# Patient Record
Sex: Male | Born: 1990 | Race: Black or African American | Hispanic: No | Marital: Single | Smoking: Current every day smoker
Health system: Southern US, Community
[De-identification: ages and names within clinical notes are randomized; demographics above are authoritative.]

---

## 2011-07-22 ENCOUNTER — Encounter (HOSPITAL_BASED_OUTPATIENT_CLINIC_OR_DEPARTMENT_OTHER): Payer: Self-pay | Admitting: *Deleted

## 2011-07-22 ENCOUNTER — Emergency Department (HOSPITAL_BASED_OUTPATIENT_CLINIC_OR_DEPARTMENT_OTHER)
Admission: EM | Admit: 2011-07-22 | Discharge: 2011-07-22 | Disposition: A | Discharge status: Home / Self Care | Payer: Self-pay | Attending: Emergency Medicine | Admitting: Emergency Medicine

## 2011-07-22 DIAGNOSIS — T24239A Burn of second degree of unspecified lower leg, initial encounter: Secondary | ICD-10-CM

## 2011-07-22 DIAGNOSIS — F17200 Nicotine dependence, unspecified, uncomplicated: Secondary | ICD-10-CM | POA: Insufficient documentation

## 2011-07-22 DIAGNOSIS — X131XXA Other contact with steam and other hot vapors, initial encounter: Secondary | ICD-10-CM | POA: Insufficient documentation

## 2011-07-22 DIAGNOSIS — X12XXXA Contact with other hot fluids, initial encounter: Secondary | ICD-10-CM | POA: Insufficient documentation

## 2011-07-22 DIAGNOSIS — T25219A Burn of second degree of unspecified ankle, initial encounter: Secondary | ICD-10-CM | POA: Insufficient documentation

## 2011-07-22 DIAGNOSIS — Y93G3 Activity, cooking and baking: Secondary | ICD-10-CM | POA: Insufficient documentation

## 2011-07-22 DIAGNOSIS — T25229A Burn of second degree of unspecified foot, initial encounter: Secondary | ICD-10-CM | POA: Insufficient documentation

## 2011-07-22 MED ORDER — SILVER SULFADIAZINE 1 % EX CREA: TOPICAL_CREAM | Freq: Every day | CUTANEOUS | Status: AC

## 2011-07-22 MED ORDER — OXYCODONE-ACETAMINOPHEN 5-325 MG PO TABS: 1.0000 | ORAL_TABLET | Freq: Four times a day (QID) | ORAL | Status: AC | PRN

## 2011-07-22 MED ORDER — SILVER SULFADIAZINE 1 % EX CREA
TOPICAL_CREAM | CUTANEOUS | Status: AC
  Filled 2011-07-22: qty 85

## 2011-07-22 MED ORDER — SILVER SULFADIAZINE 1 % EX CREA
TOPICAL_CREAM | Freq: Once | CUTANEOUS | Status: AC
  Administered 2011-07-22: 16:00:00 via TOPICAL
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
## 2013-04-11 ENCOUNTER — Emergency Department (HOSPITAL_BASED_OUTPATIENT_CLINIC_OR_DEPARTMENT_OTHER): Payer: No Typology Code available for payment source

## 2013-04-11 ENCOUNTER — Encounter (HOSPITAL_BASED_OUTPATIENT_CLINIC_OR_DEPARTMENT_OTHER): Payer: Self-pay | Admitting: Emergency Medicine

## 2013-04-11 ENCOUNTER — Emergency Department (HOSPITAL_BASED_OUTPATIENT_CLINIC_OR_DEPARTMENT_OTHER)
Admission: EM | Admit: 2013-04-11 | Discharge: 2013-04-11 | Disposition: A | Discharge status: Home / Self Care | Payer: No Typology Code available for payment source | Attending: Emergency Medicine | Admitting: Emergency Medicine

## 2013-04-11 DIAGNOSIS — Y9389 Activity, other specified: Secondary | ICD-10-CM | POA: Insufficient documentation

## 2013-04-11 DIAGNOSIS — R079 Chest pain, unspecified: Secondary | ICD-10-CM

## 2013-04-11 DIAGNOSIS — M542 Cervicalgia: Secondary | ICD-10-CM

## 2013-04-11 DIAGNOSIS — S199XXA Unspecified injury of neck, initial encounter: Principal | ICD-10-CM

## 2013-04-11 DIAGNOSIS — S0993XA Unspecified injury of face, initial encounter: Secondary | ICD-10-CM | POA: Insufficient documentation

## 2013-04-11 DIAGNOSIS — F17200 Nicotine dependence, unspecified, uncomplicated: Secondary | ICD-10-CM | POA: Insufficient documentation

## 2013-04-11 DIAGNOSIS — M25579 Pain in unspecified ankle and joints of unspecified foot: Secondary | ICD-10-CM

## 2013-04-11 DIAGNOSIS — S298XXA Other specified injuries of thorax, initial encounter: Secondary | ICD-10-CM | POA: Insufficient documentation

## 2013-04-11 DIAGNOSIS — Y92410 Unspecified street and highway as the place of occurrence of the external cause: Secondary | ICD-10-CM | POA: Insufficient documentation

## 2013-04-11 MED ORDER — HYDROCODONE-ACETAMINOPHEN 5-325 MG PO TABS
1.0000 | ORAL_TABLET | Freq: Once | ORAL | Status: AC
  Administered 2013-04-11: 1 via ORAL
  Filled 2013-04-11: qty 1

## 2013-04-11 MED ORDER — IBUPROFEN 800 MG PO TABS: 800.0000 mg | ORAL_TABLET | Freq: Three times a day (TID) | ORAL | Status: DC

## 2013-04-11 MED ORDER — HYDROCODONE-ACETAMINOPHEN 5-325 MG PO TABS: 1.0000 | ORAL_TABLET | ORAL | Status: DC | PRN

## 2013-04-11 MED ORDER — IBUPROFEN 800 MG PO TABS
800.0000 mg | ORAL_TABLET | Freq: Once | ORAL | Status: AC
  Administered 2013-04-11: 800 mg via ORAL
  Filled 2013-04-11: qty 1
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
## 2013-04-11 NOTE — ED Provider Notes (Signed)
Medical screening examination/treatment/procedure(s) were performed by non-physician practitioner and as supervising physician I was immediately available for consultation/collaboration.  EKG Interpretation   None         Layla MawKristen N Bethanne Mule, DO 04/11/13 1510
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
## 2013-04-30 ENCOUNTER — Emergency Department (HOSPITAL_BASED_OUTPATIENT_CLINIC_OR_DEPARTMENT_OTHER)
Admission: EM | Admit: 2013-04-30 | Discharge: 2013-04-30 | Disposition: A | Discharge status: Home / Self Care | Payer: No Typology Code available for payment source | Attending: Emergency Medicine | Admitting: Emergency Medicine

## 2013-04-30 ENCOUNTER — Encounter (HOSPITAL_BASED_OUTPATIENT_CLINIC_OR_DEPARTMENT_OTHER): Payer: Self-pay | Admitting: Emergency Medicine

## 2013-04-30 DIAGNOSIS — M542 Cervicalgia: Secondary | ICD-10-CM

## 2013-04-30 DIAGNOSIS — F17200 Nicotine dependence, unspecified, uncomplicated: Secondary | ICD-10-CM | POA: Insufficient documentation

## 2013-04-30 MED ORDER — METHOCARBAMOL 500 MG PO TABS: 500.0000 mg | ORAL_TABLET | Freq: Two times a day (BID) | ORAL | Status: DC

## 2013-04-30 MED ORDER — HYDROCODONE-ACETAMINOPHEN 5-325 MG PO TABS: 2.0000 | ORAL_TABLET | ORAL | Status: DC | PRN

## 2013-04-30 MED ORDER — IBUPROFEN 800 MG PO TABS: 800.0000 mg | ORAL_TABLET | Freq: Three times a day (TID) | ORAL | Status: DC

## 2013-04-30 NOTE — ED Provider Notes (Signed)
CSN: [id number]     Arrival date & time 04/30/13  1324 History   First MD Initiated Contact with Patient 04/30/13 1435     Chief Complaint  Patient presents with  . Neck Pain     (Consider location/radiation/quality/duration/timing/severity/associated sxs/prior Treatment) Patient is a 23 y.o. male presenting with neck pain. The history is provided by the patient.  Neck Pain Pain location:  Generalized neck Pain radiates to:  Does not radiate Pain severity:  Moderate Pain is:  Worse during the day Onset quality:  Sudden Timing:  Constant Progression:  Worsening Chronicity:  New Relieved by:  Nothing Worsened by:  Nothing tried Ineffective treatments:  None tried   History reviewed. No pertinent past medical history. History reviewed. No pertinent past surgical history. No family history on file. History  Substance Use Topics  . Smoking status: Current Every Day Smoker -- 1.00 packs/day    Types: Cigarettes  . Smokeless tobacco: Never Used  . Alcohol Use: Yes    Review of Systems  Musculoskeletal: Positive for neck pain.      Allergies  Review of patient's allergies indicates no known allergies.  Home Medications   Current Outpatient Rx  Name  Route  Sig  Dispense  Refill  . HYDROcodone-acetaminophen (NORCO/VICODIN) 5-325 MG per tablet   Oral   Take 1-2 tablets by mouth every 4 (four) hours as needed.   10 tablet   0   . ibuprofen (ADVIL,MOTRIN) 800 MG tablet   Oral   Take 1 tablet (800 mg total) by mouth 3 (three) times daily.   21 tablet   0    BP 154/81  Pulse 85  Temp(Src) 98.2 F (36.8 C) (Oral)  Resp 20  Ht 6\' 3"  (1.905 m)  Wt 275 lb (124.739 kg)  BMI 34.37 kg/m2  SpO2 100% Physical Exam  Nursing note and vitals reviewed. Constitutional: He appears well-developed and well-nourished.  HENT:  Head: Normocephalic.  Cardiovascular: Normal rate.   Pulmonary/Chest: Effort normal.  Musculoskeletal: Normal range of motion. He exhibits  tenderness.  Neurological: He is alert.  Skin: Skin is warm.  Psychiatric: He has a normal mood and affect.    ED Course  Procedures (including critical care time) Labs Review Labs Reviewed - No data to display Imaging Review No results found.   EKG Interpretation None      MDM Pt reports continued pain.   Pt reports pain when working.     Final diagnoses:  Neck pain    Pt referred to Dr. Phineas DouglasHudanll for evaluation.   Pt advised to return if any problems.    Elson AreasLeslie K Rockwell Zentz, PA-C 04/30/13 1508  Lonia SkinnerLeslie K HennepinSofia, PA-C 04/30/13 [phone]
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
## 2013-04-30 NOTE — Discharge Instructions (Signed)
Cervical Sprain A cervical sprain is an injury in the neck in which the strong, fibrous tissues (ligaments) that connect your neck bones stretch or tear. Cervical sprains can range from mild to severe. Severe cervical sprains can cause the neck vertebrae to be unstable. This can lead to damage of the spinal cord and can result in serious nervous system problems. The amount of time it takes for a cervical sprain to get better depends on the cause and extent of the injury. Most cervical sprains heal in 1 to 3 weeks. CAUSES  Severe cervical sprains may be caused by:   Contact sport injuries (such as from football, rugby, wrestling, hockey, auto racing, gymnastics, diving, martial arts, or boxing).   Motor vehicle collisions.   Whiplash injuries. This is an injury from a sudden forward-and backward whipping movement of the head and neck.  Falls.  Mild cervical sprains may be caused by:   Being in an awkward position, such as while cradling a telephone between your ear and shoulder.   Sitting in a chair that does not offer proper support.   Working at a poorly designed computer station.   Looking up or down for long periods of time.  SYMPTOMS   Pain, soreness, stiffness, or a burning sensation in the front, back, or sides of the neck. This discomfort may develop immediately after the injury or slowly, 24 hours or more after the injury.   Pain or tenderness directly in the middle of the back of the neck.   Shoulder or upper back pain.   Limited ability to move the neck.   Headache.   Dizziness.   Weakness, numbness, or tingling in the hands or arms.   Muscle spasms.   Difficulty swallowing or chewing.   Tenderness and swelling of the neck.  DIAGNOSIS  Most of the time your health care provider can diagnose a cervical sprain by taking your history and doing a physical exam. Your health care provider will ask about previous neck injuries and any known neck  problems, such as arthritis in the neck. X-rays may be taken to find out if there are any other problems, such as with the bones of the neck. Other tests, such as a CT scan or MRI, may also be needed.  TREATMENT  Treatment depends on the severity of the cervical sprain. Mild sprains can be treated with rest, keeping the neck in place (immobilization), and pain medicines. Severe cervical sprains are immediately immobilized. Further treatment is done to help with pain, muscle spasms, and other symptoms and may include:  Medicines, such as pain relievers, numbing medicines, or muscle relaxants.   Physical therapy. This may involve stretching exercises, strengthening exercises, and posture training. Exercises and improved posture can help stabilize the neck, strengthen muscles, and help stop symptoms from returning.  HOME CARE INSTRUCTIONS   Put ice on the injured area.   Put ice in a plastic bag.   Place a towel between your skin and the bag.   Leave the ice on for 15 20 minutes, 3 4 times a day.   If your injury was severe, you may have been given a cervical collar to wear. A cervical collar is a two-piece collar designed to keep your neck from moving while it heals.  Do not remove the collar unless instructed by your health care provider.  If you have long hair, keep it outside of the collar.  Ask your health care provider before making any adjustments to your collar.   Minor adjustments may be required over time to improve comfort and reduce pressure on your chin or on the back of your head.  Ifyou are allowed to remove the collar for cleaning or bathing, follow your health care provider's instructions on how to do so safely.  Keep your collar clean by wiping it with mild soap and water and drying it completely. If the collar you have been given includes removable pads, remove them every 1 2 days and hand wash them with soap and water. Allow them to air dry. They should be completely  dry before you wear them in the collar.  If you are allowed to remove the collar for cleaning and bathing, wash and dry the skin of your neck. Check your skin for irritation or sores. If you see any, tell your health care provider.  Do not drive while wearing the collar.   Only take over-the-counter or prescription medicines for pain, discomfort, or fever as directed by your health care provider.   Keep all follow-up appointments as directed by your health care provider.   Keep all physical therapy appointments as directed by your health care provider.   Make any needed adjustments to your workstation to promote good posture.   Avoid positions and activities that make your symptoms worse.   Warm up and stretch before being active to help prevent problems.  SEEK MEDICAL CARE IF:   Your pain is not controlled with medicine.   You are unable to decrease your pain medicine over time as planned.   Your activity level is not improving as expected.  SEEK IMMEDIATE MEDICAL CARE IF:   You develop any bleeding.  You develop stomach upset.  You have signs of an allergic reaction to your medicine.   Your symptoms get worse.   You develop new, unexplained symptoms.   You have numbness, tingling, weakness, or paralysis in any part of your body.  MAKE SURE YOU:   Understand these instructions.  Will watch your condition.  Will get help right away if you are not doing well or get worse. Document Released: 11/28/2006 Document Revised: 11/21/2012 Document Reviewed: 08/08/2012 [employer] Patient Information 2014 [employer].  

## 2013-04-30 NOTE — ED Provider Notes (Signed)
History/physical exam/procedure(s) were performed by non-physician practitioner and as supervising physician I was immediately available for consultation/collaboration. I have reviewed all notes and am in agreement with care and plan.   Tonjua Rossetti S Franchelle Foskett, MD 04/30/13 1605 

## 2013-05-13 ENCOUNTER — Ambulatory Visit: Payer: Self-pay | Admitting: Family Medicine

## 2015-05-18 ENCOUNTER — Encounter (HOSPITAL_BASED_OUTPATIENT_CLINIC_OR_DEPARTMENT_OTHER): Payer: Self-pay | Admitting: Emergency Medicine

## 2015-05-18 ENCOUNTER — Emergency Department (HOSPITAL_BASED_OUTPATIENT_CLINIC_OR_DEPARTMENT_OTHER)
Admission: EM | Admit: 2015-05-18 | Discharge: 2015-05-19 | Disposition: A | Discharge status: Home / Self Care | Payer: Self-pay | Attending: Emergency Medicine | Admitting: Emergency Medicine

## 2015-05-18 DIAGNOSIS — J020 Streptococcal pharyngitis: Secondary | ICD-10-CM

## 2015-05-18 DIAGNOSIS — Z791 Long term (current) use of non-steroidal anti-inflammatories (NSAID): Secondary | ICD-10-CM | POA: Insufficient documentation

## 2015-05-18 DIAGNOSIS — F17210 Nicotine dependence, cigarettes, uncomplicated: Secondary | ICD-10-CM | POA: Insufficient documentation

## 2015-05-18 DIAGNOSIS — J3489 Other specified disorders of nose and nasal sinuses: Secondary | ICD-10-CM | POA: Insufficient documentation

## 2015-05-18 LAB — RAPID STREP SCREEN (MED CTR MEBANE ONLY): STREPTOCOCCUS, GROUP A SCREEN (DIRECT): POSITIVE — AB

## 2015-05-18 MED ORDER — DEXAMETHASONE SODIUM PHOSPHATE 10 MG/ML IJ SOLN
10.0000 mg | Freq: Once | INTRAMUSCULAR | Status: AC
  Administered 2015-05-19: 10 mg via INTRAMUSCULAR
  Filled 2015-05-18: qty 1

## 2015-05-18 MED ORDER — PENICILLIN G BENZATHINE 1200000 UNIT/2ML IM SUSP
1.2000 10*6.[IU] | Freq: Once | INTRAMUSCULAR | Status: AC
  Administered 2015-05-19: 1.2 10*6.[IU] via INTRAMUSCULAR
  Filled 2015-05-18: qty 2

## 2015-05-18 NOTE — ED Notes (Signed)
Patient states that he has had a sore throat with trouble swallowing x 1 week. Patient reports that it is not getting any better after a week and felt like tonight it was getting worse

## 2015-05-19 MED ORDER — CLINDAMYCIN HCL 150 MG PO CAPS: 450.0000 mg | ORAL_CAPSULE | Freq: Three times a day (TID) | ORAL | Status: DC

## 2015-05-19 NOTE — ED Notes (Signed)
C/o sore throat x 5 days

## 2015-05-19 NOTE — Discharge Instructions (Signed)
Strep Throat °Strep throat is a bacterial infection of the throat. Your health care provider may call the infection tonsillitis or pharyngitis, depending on whether there is swelling in the tonsils or at the back of the throat. Strep throat is most common during the cold months of the year in children who are 5-25 years of age, but it can happen during any season in people of any age. This infection is spread from person to person (contagious) through coughing, sneezing, or close contact. °CAUSES °Strep throat is caused by the bacteria called Streptococcus pyogenes. °RISK FACTORS °This condition is more likely to develop in: °· People who spend time in crowded places where the infection can spread easily. °· People who have close contact with someone who has strep throat. °SYMPTOMS °Symptoms of this condition include: °· Fever or chills.   °· Redness, swelling, or pain in the tonsils or throat. °· Pain or difficulty when swallowing. °· White or yellow spots on the tonsils or throat. °· Swollen, tender glands in the neck or under the jaw. °· Red rash all over the body (rare). °DIAGNOSIS °This condition is diagnosed by performing a rapid strep test or by taking a swab of your throat (throat culture test). Results from a rapid strep test are usually ready in a few minutes, but throat culture test results are available after one or two days. °TREATMENT °This condition is treated with antibiotic medicine. °HOME CARE INSTRUCTIONS °Medicines °· Take over-the-counter and prescription medicines only as told by your health care provider. °· Take your antibiotic as told by your health care provider. Do not stop taking the antibiotic even if you start to feel better. °· Have family members who also have a sore throat or fever tested for strep throat. They may need antibiotics if they have the strep infection. °Eating and Drinking °· Do not share food, drinking cups, or personal items that could cause the infection to spread to  other people. °· If swallowing is difficult, try eating soft foods until your sore throat feels better. °· Drink enough fluid to keep your urine clear or pale yellow. °General Instructions °· Gargle with a salt-water mixture 3-4 times per day or as needed. To make a salt-water mixture, completely dissolve ½-1 tsp of salt in 1 cup of warm water. °· Make sure that all household members wash their hands well. °· Get plenty of rest. °· Stay home from school or work until you have been taking antibiotics for 24 hours. °· Keep all follow-up visits as told by your health care provider. This is important. °SEEK MEDICAL CARE IF: °· The glands in your neck continue to get bigger. °· You develop a rash, cough, or earache. °· You cough up a thick liquid that is green, yellow-brown, or bloody. °· You have pain or discomfort that does not get better with medicine. °· Your problems seem to be getting worse rather than better. °· You have a fever. °SEEK IMMEDIATE MEDICAL CARE IF: °· You have new symptoms, such as vomiting, severe headache, stiff or painful neck, chest pain, or shortness of breath. °· You have severe throat pain, drooling, or changes in your voice. °· You have swelling of the neck, or the skin on the neck becomes red and tender. °· You have signs of dehydration, such as fatigue, dry mouth, and decreased urination. °· You become increasingly sleepy, or you cannot wake up completely. °· Your joints become red or painful. °  °This information is not intended to replace   advice given to you by your health care provider. Make sure you discuss any questions you have with your health care provider.  Take clindamycin as prescribed. Take tylenol as needed for fever. Follow up with ENT as indicated above if your symptoms do not improve in 1 week. Return to the ED if you experience severe worsening of your symptoms, difficulty breathing or swallowing, inability to swallow your own spit, neck pain/stiffness.

## 2015-05-19 NOTE — ED Provider Notes (Signed)
CSN: [id number]     Arrival date & time 05/18/15  2219 History   First MD Initiated Contact with Patient 05/18/15 2335     Chief Complaint  Patient presents with  . Sore Throat     (Consider location/radiation/quality/duration/timing/severity/associated sxs/prior Treatment) HPI   Antonio Mckenzie is a 25 year old male with no significant past medical history who presents the ED complaining of sore throat onset 6 days ago. Patient states the pain in his throat is progressively worsening and he has painful swallowing. Patient is unable to swallow solids but is able to swallow his own spit and liquids if he drinks small amounts at a time. Patient also states that his neck feels swollen and he reports subjective fevers and rhinorrhea. He denies cough, otalgia, rash. Patient has not tried taking anything for his symptoms. No sick contacts.   History reviewed. No pertinent past medical history. History reviewed. No pertinent past surgical history. History reviewed. No pertinent family history. Social History  Substance Use Topics  . Smoking status: Current Every Day Smoker -- 1.00 packs/day    Types: Cigarettes  . Smokeless tobacco: Never Used  . Alcohol Use: Yes    Review of Systems  All other systems reviewed and are negative.     Allergies  Review of patient's allergies indicates no known allergies.  Home Medications   Prior to Admission medications   Medication Sig Start Date End Date Taking? Authorizing Provider  HYDROcodone-acetaminophen (NORCO/VICODIN) 5-325 MG per tablet Take 1-2 tablets by mouth every 4 (four) hours as needed. 04/11/13   Teressa Lower, NP  HYDROcodone-acetaminophen (NORCO/VICODIN) 5-325 MG per tablet Take 2 tablets by mouth every 4 (four) hours as needed. 04/30/13   Elson Areas, PA-C  ibuprofen (ADVIL,MOTRIN) 800 MG tablet Take 1 tablet (800 mg total) by mouth 3 (three) times daily. 04/11/13   Teressa Lower, NP  ibuprofen (ADVIL,MOTRIN) 800 MG tablet  Take 1 tablet (800 mg total) by mouth 3 (three) times daily. 04/30/13   Elson Areas, PA-C  methocarbamol (ROBAXIN) 500 MG tablet Take 1 tablet (500 mg total) by mouth 2 (two) times daily. 04/30/13   Elson Areas, PA-C   BP 146/88 mmHg  Pulse 83  Temp(Src) 99.3 F (37.4 C) (Oral)  Resp 18  Ht  (1.905 m)  Wt 136.079 kg  BMI 37.50 kg/m2  SpO2 100% Physical Exam  Constitutional: He is oriented to person, place, and time. He appears well-developed and well-nourished. No distress.  HENT:  Head: Normocephalic and atraumatic.  Mouth/Throat: Uvula is midline and mucous membranes are normal. No trismus in the jaw. Oropharyngeal exudate, posterior oropharyngeal edema and posterior oropharyngeal erythema present.  Right tonsil appears larger than left. No obvious peritonsillar abscess seen.  Eyes: Conjunctivae are normal. Right eye exhibits no discharge. Left eye exhibits no discharge. No scleral icterus.  Cardiovascular: Normal rate.   Pulmonary/Chest: Effort normal.  Lymphadenopathy:    He has cervical adenopathy.  Neurological: He is alert and oriented to person, place, and time. Coordination normal.  Skin: Skin is warm and dry. No rash noted. He is not diaphoretic. No erythema. No pallor.  Psychiatric: He has a normal mood and affect. His behavior is normal.  Nursing note and vitals reviewed.   ED Course  Procedures (including critical care time) Labs Review Labs Reviewed  RAPID STREP SCREEN (NOT AT [facility]) - Abnormal; Notable for the following:    Streptococcus, Group A Screen (Direct) POSITIVE (*)    All other components within  normal limits    Imaging Review No results found. I have personally reviewed and evaluated these images and lab results as part of my medical decision-making.   EKG Interpretation None      MDM   Final diagnoses:  Strep pharyngitis   Pt febrile with tonsillar exudate, cervical lymphadenopathy, & dysphagia; diagnosis of strep. Treated in the  Ed with steroids and PCN IM. Right tonsil is much larger than left. No obvious PTA seen. Pt is tolerating his secretions. No advanced imaging indicated at this time. Will d/c with clindamycin to cover for staph. Pt appears mildly dehydrated, discussed importance of water rehydration. Presentation non concerning for PTA or infxn spread to soft tissue. No trismus or uvula deviation. Specific return precautions discussed. Pt able to drink water in ED without difficulty with intact air way. Recommended PCP follow up.   Patient was discussed with and seen by Dr. Wilkie AyeHorton who agrees with the treatment plan.     Lester KinsmanSamantha Tripp [place]orneyvilleDow[place], PA-C 05/19/15 0107  Shon Batonourtney F Horton, MD 05/19/15 [phone]
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
## 2016-07-29 ENCOUNTER — Encounter (HOSPITAL_BASED_OUTPATIENT_CLINIC_OR_DEPARTMENT_OTHER): Payer: Self-pay | Admitting: *Deleted

## 2016-07-29 ENCOUNTER — Emergency Department (HOSPITAL_BASED_OUTPATIENT_CLINIC_OR_DEPARTMENT_OTHER)
Admission: EM | Admit: 2016-07-29 | Discharge: 2016-07-29 | Disposition: A | Discharge status: Home / Self Care | Payer: Self-pay | Attending: Emergency Medicine | Admitting: Emergency Medicine

## 2016-07-29 DIAGNOSIS — Y929 Unspecified place or not applicable: Secondary | ICD-10-CM | POA: Insufficient documentation

## 2016-07-29 DIAGNOSIS — Z79899 Other long term (current) drug therapy: Secondary | ICD-10-CM | POA: Insufficient documentation

## 2016-07-29 DIAGNOSIS — Y999 Unspecified external cause status: Secondary | ICD-10-CM | POA: Insufficient documentation

## 2016-07-29 DIAGNOSIS — Z791 Long term (current) use of non-steroidal anti-inflammatories (NSAID): Secondary | ICD-10-CM | POA: Insufficient documentation

## 2016-07-29 DIAGNOSIS — S0104XA Puncture wound with foreign body of scalp, initial encounter: Secondary | ICD-10-CM | POA: Insufficient documentation

## 2016-07-29 DIAGNOSIS — Y9389 Activity, other specified: Secondary | ICD-10-CM | POA: Insufficient documentation

## 2016-07-29 DIAGNOSIS — W458XXA Other foreign body or object entering through skin, initial encounter: Secondary | ICD-10-CM | POA: Insufficient documentation

## 2016-07-29 DIAGNOSIS — S0990XA Unspecified injury of head, initial encounter: Secondary | ICD-10-CM

## 2016-07-29 MED ORDER — LIDOCAINE HCL (PF) 1 % IJ SOLN
5.0000 mL | Freq: Once | INTRAMUSCULAR | Status: AC
  Administered 2016-07-29: 5 mL via INTRADERMAL
  Filled 2016-07-29: qty 5

## 2016-07-29 NOTE — ED Provider Notes (Signed)
[facility]   CSN: [id number] Arrival date & time: 07/29/16  1502     History   Chief Complaint Chief Complaint  Patient presents with  . Foreign Body in Skin    HPI Antonio Mckenzie is a 26 y.o. male.  Patient is a 26 year old male who presents with complaints of a fishhook in the back of his scalp. His friend was casting a fishing rod when he inadvertently snared the back of his head. There are no aggravating or alleviating factors. The patient was unable to remove the hook with pliers.   The history is provided by the patient.    History reviewed. No pertinent past medical history.  There are no active problems to display for this patient.   History reviewed. No pertinent surgical history.     Home Medications    Prior to Admission medications   Medication Sig Start Date End Date Taking? Authorizing Provider  clindamycin (CLEOCIN) 150 MG capsule Take 3 capsules (450 mg total) by mouth 3 (three) times daily. 05/19/15   Dowless, Lelon MastSamantha Tripp, PA-C  HYDROcodone-acetaminophen (NORCO/VICODIN) 5-325 MG per tablet Take 1-2 tablets by mouth every 4 (four) hours as needed. 04/11/13   Teressa LowerPickering, Vrinda, NP  HYDROcodone-acetaminophen (NORCO/VICODIN) 5-325 MG per tablet Take 2 tablets by mouth every 4 (four) hours as needed. 04/30/13   Elson AreasSofia, Leslie K, PA-C  ibuprofen (ADVIL,MOTRIN) 800 MG tablet Take 1 tablet (800 mg total) by mouth 3 (three) times daily. 04/11/13   Teressa LowerPickering, Vrinda, NP  ibuprofen (ADVIL,MOTRIN) 800 MG tablet Take 1 tablet (800 mg total) by mouth 3 (three) times daily. 04/30/13   Elson AreasSofia, Leslie K, PA-C  methocarbamol (ROBAXIN) 500 MG tablet Take 1 tablet (500 mg total) by mouth 2 (two) times daily. 04/30/13   Elson AreasSofia, Leslie K, PA-C    Family History No family history on file.  Social History Social History  Substance Use Topics  . Smoking status: Current Every Day Smoker    Packs/day: 1.00    Types: Cigarettes  . Smokeless tobacco:  Never Used  . Alcohol use Yes     Allergies   Patient has no known allergies.   Review of Systems Review of Systems  All other systems reviewed and are negative.    Physical Exam Updated Vital Signs BP (!) 152/102   Pulse 94   Temp 98.3 F (36.8 C) (Oral)   Resp 18   Ht 6\' 3"  (1.905 m)   Wt 136.1 kg (300 lb)   SpO2 99%   BMI 37.50 kg/m   Physical Exam  Constitutional: He appears well-developed and well-nourished. No distress.  HENT:  Head: Normocephalic.  There is a fishing lure with 2 treble hooks embedded in the left posterior scalp. One prong is embedded under the skin.  Neck: Normal range of motion. Neck supple.  Skin: Skin is warm and dry. He is not diaphoretic.  Nursing note and vitals reviewed.    ED Treatments / Results  Labs (all labs ordered are listed, but only abnormal results are displayed) Labs Reviewed - No data to display  EKG  EKG Interpretation None       Radiology No results found.  Procedures Procedures (including critical care time)  Medications Ordered in ED Medications  lidocaine (PF) (XYLOCAINE) 1 % injection 5 mL (5 mLs Intradermal Given 07/29/16 1627)     Initial Impression / Assessment and Plan / ED Course  I have reviewed the triage vital signs and the nursing notes.  Pertinent labs & imaging results that were available during my care of the patient were reviewed by me and considered in my medical decision making (see chart for details).  1% lidocaine was injected into the hole made by the fishing lure. A #11 blade was used to make a small neck in the skin, then a needle driver was used to remove the hook. The patient tolerated the procedure well and will be discharged with local wound care.  Final Clinical Impressions(s) / ED Diagnoses   Final diagnoses:  Fish hook injury of scalp, initial encounter    New Prescriptions Discharge Medication List as of 07/29/2016  4:22 PM       Geoffery Lyons, MD 07/29/16  2150

## 2016-07-29 NOTE — Discharge Instructions (Signed)
Return to the emergency department for increasing pain, pus draining from the wound, increased redness, or other new and concerning symptoms

## 2016-07-29 NOTE — ED Notes (Signed)
Has a fishing hook stuck in scalp, no bleeding noted

## 2016-07-29 NOTE — ED Triage Notes (Signed)
Fish hook in the back of his head 20 minutes ago.

## 2016-07-29 NOTE — ED Notes (Signed)
ED MD in to remove fish hook from scalp, tol well

## 2018-06-26 ENCOUNTER — Encounter (HOSPITAL_BASED_OUTPATIENT_CLINIC_OR_DEPARTMENT_OTHER): Payer: Self-pay

## 2018-06-26 ENCOUNTER — Emergency Department (HOSPITAL_BASED_OUTPATIENT_CLINIC_OR_DEPARTMENT_OTHER)
Admission: EM | Admit: 2018-06-26 | Discharge: 2018-06-27 | Disposition: A | Discharge status: Home / Self Care | Payer: Self-pay | Attending: Emergency Medicine | Admitting: Emergency Medicine

## 2018-06-26 ENCOUNTER — Other Ambulatory Visit: Payer: Self-pay

## 2018-06-26 DIAGNOSIS — F17210 Nicotine dependence, cigarettes, uncomplicated: Secondary | ICD-10-CM | POA: Insufficient documentation

## 2018-06-26 DIAGNOSIS — J039 Acute tonsillitis, unspecified: Secondary | ICD-10-CM | POA: Insufficient documentation

## 2018-06-26 DIAGNOSIS — Z79899 Other long term (current) drug therapy: Secondary | ICD-10-CM | POA: Insufficient documentation

## 2018-06-26 MED ORDER — PENICILLIN G BENZATHINE 1200000 UNIT/2ML IM SUSP
1.2000 10*6.[IU] | Freq: Once | INTRAMUSCULAR | Status: AC
  Administered 2018-06-27: 1.2 10*6.[IU] via INTRAMUSCULAR
  Filled 2018-06-26: qty 2

## 2018-06-26 MED ORDER — DEXAMETHASONE SODIUM PHOSPHATE 10 MG/ML IJ SOLN
10.0000 mg | Freq: Once | INTRAMUSCULAR | Status: AC
  Administered 2018-06-27: 10 mg via INTRAMUSCULAR
  Filled 2018-06-26: qty 1

## 2018-06-26 NOTE — ED Provider Notes (Signed)
[facility] EMERGENCY DEPARTMENT Provider Note   CSN: [id number] Arrival date & time: 06/26/18  2344    History   Chief Complaint Chief Complaint  Patient presents with  . Sore Throat    HPI Antonio Mckenzie is a 28 y.o. male.     Patient presents to the emergency department for evaluation of sore throat.  Patient reports that he started having some sore throat yesterday, got better with over-the-counter NSAIDs.  Today the throat is hurting worse.  He has not been able to eat or drink much because it hurts when he swallows.  Pain is more on the right side.  He has not had any fever.  No other cold symptoms.     No past medical history on file.  There are no active problems to display for this patient.   No past surgical history on file.      Home Medications    Prior to Admission medications   Medication Sig Start Date End Date Taking? Authorizing Provider  clindamycin (CLEOCIN) 150 MG capsule Take 3 capsules (450 mg total) by mouth 3 (three) times daily. 05/19/15   Dowless, Lelon Mast Tripp, PA-C  HYDROcodone-acetaminophen (NORCO/VICODIN) 5-325 MG per tablet Take 1-2 tablets by mouth every 4 (four) hours as needed. 04/11/13   Teressa Lower, NP  HYDROcodone-acetaminophen (NORCO/VICODIN) 5-325 MG per tablet Take 2 tablets by mouth every 4 (four) hours as needed. 04/30/13   Elson Areas, PA-C  ibuprofen (ADVIL,MOTRIN) 800 MG tablet Take 1 tablet (800 mg total) by mouth 3 (three) times daily. 04/11/13   Teressa Lower, NP  ibuprofen (ADVIL,MOTRIN) 800 MG tablet Take 1 tablet (800 mg total) by mouth 3 (three) times daily. 04/30/13   Elson Areas, PA-C  methocarbamol (ROBAXIN) 500 MG tablet Take 1 tablet (500 mg total) by mouth 2 (two) times daily. 04/30/13   Elson Areas, PA-C    Family History No family history on file.  Social History Social History   Tobacco Use  . Smoking status: Current Every Day Smoker    Packs/day: 1.00    Types: Cigarettes   . Smokeless tobacco: Never Used  Substance Use Topics  . Alcohol use: Yes  . Drug use: Yes    Types: Marijuana     Allergies   Patient has no known allergies.   Review of Systems Review of Systems  HENT: Positive for sore throat.   All other systems reviewed and are negative.    Physical Exam Updated Vital Signs BP (!) 169/90   Pulse 69   Temp 98.2 F (36.8 C) (Oral)   Resp 16   Ht 6\' 4"  (1.93 m)   Wt (!) 145.2 kg   SpO2 100%   BMI 38.95 kg/m   Physical Exam Vitals signs and nursing note reviewed.  Constitutional:      General: He is not in acute distress.    Appearance: Normal appearance. He is well-developed.  HENT:     Head: Normocephalic and atraumatic.     Right Ear: Hearing normal.     Left Ear: Hearing normal.     Nose: Nose normal.     Mouth/Throat:     Tonsils: Tonsillar exudate present. No tonsillar abscesses. 2+ on the right. 1+ on the left.  Eyes:     Conjunctiva/sclera: Conjunctivae normal.     Pupils: Pupils are equal, round, and reactive to light.  Neck:     Musculoskeletal: Normal range of motion and neck supple.  Cardiovascular:  Rate and Rhythm: Regular rhythm.     Heart sounds: S1 normal and S2 normal. No murmur. No friction rub. No gallop.   Pulmonary:     Effort: Pulmonary effort is normal. No respiratory distress.     Breath sounds: Normal breath sounds.  Chest:     Chest wall: No tenderness.  Abdominal:     General: Bowel sounds are normal.     Palpations: Abdomen is soft.     Tenderness: There is no abdominal tenderness. There is no guarding or rebound. Negative signs include Murphy's sign and McBurney's sign.     Hernia: No hernia is present.  Musculoskeletal: Normal range of motion.  Lymphadenopathy:     Cervical: Cervical adenopathy present.     Right cervical: Superficial cervical adenopathy present.  Skin:    General: Skin is warm and dry.     Findings: No rash.  Neurological:     Mental Status: He is alert and  oriented to person, place, and time.     GCS: GCS eye subscore is 4. GCS verbal subscore is 5. GCS motor subscore is 6.     Cranial Nerves: No cranial nerve deficit.     Sensory: No sensory deficit.     Coordination: Coordination normal.  Psychiatric:        Speech: Speech normal.        Behavior: Behavior normal.        Thought Content: Thought content normal.      ED Treatments / Results  Labs (all labs ordered are listed, but only abnormal results are displayed) Labs Reviewed - No data to display  EKG None  Radiology No results found.  Procedures Procedures (including critical care time)  Medications Ordered in ED Medications  penicillin g benzathine (BICILLIN LA) 1200000 UNIT/2ML injection 1.2 Million Units (has no administration in time range)  dexamethasone (DECADRON) injection 10 mg (has no administration in time range)     Initial Impression / Assessment and Plan / ED Course  I have reviewed the triage vital signs and the nursing notes.  Pertinent labs & imaging results that were available during my care of the patient were reviewed by me and considered in my medical decision making (see chart for details).        Patient with erythematous, swollen tonsils with exudate.  No sign of tonsillar abscess.  Final Clinical Impressions(s) / ED Diagnoses   Final diagnoses:  Tonsillitis    ED Discharge Orders    None       Gilda CreasePollina, Christopher J, MD 06/26/18 2358

## 2018-06-26 NOTE — ED Triage Notes (Signed)
Sore throat since yesterday morning, painful to swallow, pt denies fevers, has hx of multiple instances of strep throat

## 2018-06-27 NOTE — ED Notes (Signed)
Pt verbalizes understanding of d/c instructions and denies any further needs at this time. 

## 2018-06-30 ENCOUNTER — Encounter (HOSPITAL_BASED_OUTPATIENT_CLINIC_OR_DEPARTMENT_OTHER): Payer: Self-pay | Admitting: Emergency Medicine

## 2018-06-30 ENCOUNTER — Emergency Department (HOSPITAL_BASED_OUTPATIENT_CLINIC_OR_DEPARTMENT_OTHER)
Admission: EM | Admit: 2018-06-30 | Discharge: 2018-06-30 | Disposition: A | Discharge status: Home / Self Care | Payer: Self-pay | Attending: Emergency Medicine | Admitting: Emergency Medicine

## 2018-06-30 ENCOUNTER — Other Ambulatory Visit: Payer: Self-pay

## 2018-06-30 DIAGNOSIS — J36 Peritonsillar abscess: Secondary | ICD-10-CM

## 2018-06-30 DIAGNOSIS — F17210 Nicotine dependence, cigarettes, uncomplicated: Secondary | ICD-10-CM | POA: Insufficient documentation

## 2018-06-30 DIAGNOSIS — Z79899 Other long term (current) drug therapy: Secondary | ICD-10-CM | POA: Insufficient documentation

## 2018-06-30 DIAGNOSIS — J020 Streptococcal pharyngitis: Secondary | ICD-10-CM | POA: Insufficient documentation

## 2018-06-30 LAB — CBC WITH DIFFERENTIAL/PLATELET
Abs Immature Granulocytes: 0.02 10*3/uL (ref 0.00–0.07)
Basophils Absolute: 0.1 10*3/uL (ref 0.0–0.1)
Basophils Relative: 1 %
Eosinophils Absolute: 0.1 10*3/uL (ref 0.0–0.5)
Eosinophils Relative: 1 %
HCT: 46.8 % (ref 39.0–52.0)
Hemoglobin: 15.1 g/dL (ref 13.0–17.0)
Immature Granulocytes: 0 %
Lymphocytes Relative: 35 %
Lymphs Abs: 3.4 10*3/uL (ref 0.7–4.0)
MCH: 25.6 pg — ABNORMAL LOW (ref 26.0–34.0)
MCHC: 32.3 g/dL (ref 30.0–36.0)
MCV: 79.3 fL — ABNORMAL LOW (ref 80.0–100.0)
Monocytes Absolute: 1 10*3/uL (ref 0.1–1.0)
Monocytes Relative: 10 %
Neutro Abs: 5.2 10*3/uL (ref 1.7–7.7)
Neutrophils Relative %: 53 %
Platelets: 274 10*3/uL (ref 150–400)
RBC: 5.9 MIL/uL — ABNORMAL HIGH (ref 4.22–5.81)
RDW: 14.9 % (ref 11.5–15.5)
Smear Review: NORMAL
WBC: 9.7 10*3/uL (ref 4.0–10.5)
nRBC: 0 % (ref 0.0–0.2)

## 2018-06-30 LAB — BASIC METABOLIC PANEL
Anion gap: 8 (ref 5–15)
BUN: 12 mg/dL (ref 6–20)
CO2: 21 mmol/L — ABNORMAL LOW (ref 22–32)
Calcium: 8.6 mg/dL — ABNORMAL LOW (ref 8.9–10.3)
Chloride: 105 mmol/L (ref 98–111)
Creatinine, Ser: 1.12 mg/dL (ref 0.61–1.24)
GFR calc Af Amer: >60 mL/min (ref >60)
GFR calc non Af Amer: >60 mL/min (ref >60)
Glucose, Bld: 97 mg/dL (ref 70–99)
Potassium: 4.8 mmol/L (ref 3.5–5.1)
Sodium: 134 mmol/L — ABNORMAL LOW (ref 135–145)

## 2018-06-30 MED ORDER — CLINDAMYCIN HCL 300 MG PO CAPS: 300.0000 mg | ORAL_CAPSULE | Freq: Four times a day (QID) | ORAL | 0 refills | Status: DC

## 2018-06-30 MED ORDER — CLINDAMYCIN PHOSPHATE 900 MG/50ML IV SOLN
900.0000 mg | Freq: Once | INTRAVENOUS | Status: AC
  Administered 2018-06-30: 900 mg via INTRAVENOUS
  Filled 2018-06-30: qty 50

## 2018-06-30 MED ORDER — PREDNISONE 10 MG PO TABS: 20.0000 mg | ORAL_TABLET | Freq: Two times a day (BID) | ORAL | 0 refills | Status: DC

## 2018-06-30 MED ORDER — DEXAMETHASONE SODIUM PHOSPHATE 10 MG/ML IJ SOLN
20.0000 mg | Freq: Once | INTRAMUSCULAR | Status: AC
  Administered 2018-06-30: 20 mg via INTRAVENOUS
  Filled 2018-06-30: qty 2

## 2018-06-30 NOTE — ED Triage Notes (Signed)
Pt states he was seen here 2 days ago for same sore throat complain and got treatment, but his pain is getting worse and he is not able to go to work.

## 2018-06-30 NOTE — Discharge Instructions (Addendum)
Begin taking clindamycin and prednisone as prescribed today.  Follow-up with ENT in the next 2 to 3 days.  The contact information for Dr. Jearld Fenton office has been provided in this discharge summary for you to call on Monday and make these arrangements.  Return to the ER in the meantime if you develop difficulty breathing or swallowing, or other new and concerning symptoms.

## 2018-06-30 NOTE — ED Provider Notes (Signed)
[facility] EMERGENCY DEPARTMENT Provider Note   CSN: [id number] Arrival date & time: 06/30/18  0324    History   Chief Complaint Chief Complaint  Patient presents with  . Sore Throat    HPI Antonio Mckenzie is a 28 y.o. male.     Patient is a 28 year old male with no significant past medical history.  He presents today for evaluation of sore throat.  He was seen here 2 days ago and diagnosed with strep.  He was given Bicillin and Decadron, however is not improving.  He returns tonight stating that he is having worsening pain in the right side of his throat when he eats or swallows.  He denies fevers or chills.  The history is provided by the patient.  Sore Throat  This is a new problem. Episode onset: 4 days ago. The problem occurs constantly. The problem has been gradually worsening. The symptoms are aggravated by swallowing, eating and drinking. Nothing relieves the symptoms.    History reviewed. No pertinent past medical history.  There are no active problems to display for this patient.   History reviewed. No pertinent surgical history.      Home Medications    Prior to Admission medications   Medication Sig Start Date End Date Taking? Authorizing Provider  clindamycin (CLEOCIN) 150 MG capsule Take 3 capsules (450 mg total) by mouth 3 (three) times daily. 05/19/15   Dowless, Lelon MastSamantha Tripp, PA-C  HYDROcodone-acetaminophen (NORCO/VICODIN) 5-325 MG per tablet Take 1-2 tablets by mouth every 4 (four) hours as needed. 04/11/13   Teressa LowerPickering, Vrinda, NP  HYDROcodone-acetaminophen (NORCO/VICODIN) 5-325 MG per tablet Take 2 tablets by mouth every 4 (four) hours as needed. 04/30/13   Elson AreasSofia, Leslie K, PA-C  ibuprofen (ADVIL,MOTRIN) 800 MG tablet Take 1 tablet (800 mg total) by mouth 3 (three) times daily. 04/11/13   Teressa LowerPickering, Vrinda, NP  ibuprofen (ADVIL,MOTRIN) 800 MG tablet Take 1 tablet (800 mg total) by mouth 3 (three) times daily. 04/30/13   Elson AreasSofia, Leslie K, PA-C   methocarbamol (ROBAXIN) 500 MG tablet Take 1 tablet (500 mg total) by mouth 2 (two) times daily. 04/30/13   Elson AreasSofia, Leslie K, PA-C    Family History No family history on file.  Social History Social History   Tobacco Use  . Smoking status: Current Every Day Smoker    Packs/day: 1.00    Types: Cigarettes  . Smokeless tobacco: Never Used  Substance Use Topics  . Alcohol use: Yes  . Drug use: Yes    Types: Marijuana     Allergies   Patient has no known allergies.   Review of Systems Review of Systems  All other systems reviewed and are negative.    Physical Exam Updated Vital Signs BP (!) 152/93 (BP Location: Right Arm)   Pulse 78   Temp 98.6 F (37 C) (Oral)   Resp 16   Ht 6\' 4"  (1.93 m)   Wt (!) 145.2 kg   SpO2 100%   BMI 38.95 kg/m   Physical Exam Vitals signs and nursing note reviewed.  Constitutional:      General: He is not in acute distress.    Appearance: He is well-developed. He is not diaphoretic.  HENT:     Head: Normocephalic and atraumatic.     Mouth/Throat:     Mouth: Mucous membranes are moist.     Pharynx: Pharyngeal swelling and posterior oropharyngeal erythema present. No oropharyngeal exudate.     Tonsils: Tonsillar abscess present.  Comments: Swelling of the right tonsil with slight deviation of the tonsillar pillar.  There is no stridor. Neck:     Musculoskeletal: Normal range of motion and neck supple.  Cardiovascular:     Rate and Rhythm: Normal rate and regular rhythm.     Heart sounds: No murmur. No friction rub.  Pulmonary:     Effort: Pulmonary effort is normal. No respiratory distress.     Breath sounds: Normal breath sounds. No wheezing or rales.  Abdominal:     General: Bowel sounds are normal. There is no distension.     Palpations: Abdomen is soft.     Tenderness: There is no abdominal tenderness.  Musculoskeletal: Normal range of motion.  Skin:    General: Skin is warm and dry.  Neurological:     Mental Status: He  is alert and oriented to person, place, and time.     Coordination: Coordination normal.      ED Treatments / Results  Labs (all labs ordered are listed, but only abnormal results are displayed) Labs Reviewed  BASIC METABOLIC PANEL  CBC WITH DIFFERENTIAL/PLATELET    EKG None  Radiology No results found.  Procedures Procedures (including critical care time)  Medications Ordered in ED Medications  clindamycin (CLEOCIN) IVPB 900 mg (has no administration in time range)  dexamethasone (DECADRON) injection 20 mg (has no administration in time range)     Initial Impression / Assessment and Plan / ED Course  I have reviewed the triage vital signs and the nursing notes.  Pertinent labs & imaging results that were available during my care of the patient were reviewed by me and considered in my medical decision making (see chart for details).  Patient seen two days ago for Strep pharyngitis.  Was given bicillin and decadron.  He returns today with increasing pain in the right side of his throat.    He appears to have a developing peritonsillar abscess.  He was given clindamycin and decadron in the ED.  I believe he is appropriate for outpatient follow up with ENT.  To return as needed in the meantime.  Final Clinical Impressions(s) / ED Diagnoses   Final diagnoses:  None    ED Discharge Orders    None       Geoffery Lyons, MD 06/30/18 [phone]

## 2019-12-21 ENCOUNTER — Other Ambulatory Visit: Payer: Self-pay

## 2019-12-21 ENCOUNTER — Emergency Department (HOSPITAL_BASED_OUTPATIENT_CLINIC_OR_DEPARTMENT_OTHER)
Admission: EM | Admit: 2019-12-21 | Discharge: 2019-12-21 | Disposition: A | Discharge status: Home / Self Care | Payer: Self-pay | Attending: Emergency Medicine | Admitting: Emergency Medicine

## 2019-12-21 ENCOUNTER — Encounter (HOSPITAL_BASED_OUTPATIENT_CLINIC_OR_DEPARTMENT_OTHER): Payer: Self-pay | Admitting: Emergency Medicine

## 2019-12-21 DIAGNOSIS — F17210 Nicotine dependence, cigarettes, uncomplicated: Secondary | ICD-10-CM | POA: Insufficient documentation

## 2019-12-21 DIAGNOSIS — K5900 Constipation, unspecified: Secondary | ICD-10-CM | POA: Insufficient documentation

## 2019-12-21 NOTE — ED Triage Notes (Signed)
Pt c/o constipation and rectal pressure x 6 days

## 2019-12-21 NOTE — ED Provider Notes (Signed)
[facility] EMERGENCY DEPARTMENT Provider Note  CSN: [id number] Arrival date & time: 12/21/19 0745    History Chief Complaint  Patient presents with  . Constipation    HPI  Antonio Mckenzie is a 29 y.o. male with no significant PMH reports he has been constipated for the last several days associated with rectal pressure. Feels like he needs to have a BM but nothing will come out. He was recently in jail and thinks the food there did not agree with him. He denies any abdominal pain, vomiting. No fevers. He tried some Milk of Magnesia without much improvement. Not taking any other medications.    History reviewed. No pertinent past medical history.  History reviewed. No pertinent surgical history.  History reviewed. No pertinent family history.  Social History   Tobacco Use  . Smoking status: Current Every Day Smoker    Packs/day: 1.00    Types: Cigarettes  . Smokeless tobacco: Never Used  Substance Use Topics  . Alcohol use: Yes  . Drug use: Yes    Types: Marijuana     Home Medications Prior to Admission medications   Medication Sig Start Date End Date Taking? Authorizing Provider  clindamycin (CLEOCIN) 300 MG capsule Take 1 capsule (300 mg total) by mouth 4 (four) times daily. X 7 days 06/30/18   Geoffery Lyons, MD  HYDROcodone-acetaminophen (NORCO/VICODIN) 5-325 MG per tablet Take 1-2 tablets by mouth every 4 (four) hours as needed. 04/11/13   Teressa Lower, NP  HYDROcodone-acetaminophen (NORCO/VICODIN) 5-325 MG per tablet Take 2 tablets by mouth every 4 (four) hours as needed. 04/30/13   Elson Areas, PA-C  ibuprofen (ADVIL,MOTRIN) 800 MG tablet Take 1 tablet (800 mg total) by mouth 3 (three) times daily. 04/11/13   Teressa Lower, NP  ibuprofen (ADVIL,MOTRIN) 800 MG tablet Take 1 tablet (800 mg total) by mouth 3 (three) times daily. 04/30/13   Elson Areas, PA-C  methocarbamol (ROBAXIN) 500 MG tablet Take 1 tablet (500 mg total) by mouth 2 (two)  times daily. 04/30/13   Elson Areas, PA-C  predniSONE (DELTASONE) 10 MG tablet Take 2 tablets (20 mg total) by mouth 2 (two) times daily with a meal. 06/30/18   Geoffery Lyons, MD     Allergies    Patient has no known allergies.   Review of Systems   Review of Systems A comprehensive review of systems was completed and negative except as noted in HPI.    Physical Exam BP (!) 151/102 (BP Location: Right Arm)   Pulse 72   Temp 98.8 F (37.1 C) (Oral)   Resp 18   Ht 6\' 4"  (1.93 m)   Wt 131.5 kg   SpO2 100%   BMI 35.30 kg/m   Physical Exam Vitals and nursing note reviewed.  Constitutional:      Appearance: Normal appearance.  HENT:     Head: Normocephalic and atraumatic.     Nose: Nose normal.     Mouth/Throat:     Mouth: Mucous membranes are moist.  Eyes:     Extraocular Movements: Extraocular movements intact.     Conjunctiva/sclera: Conjunctivae normal.  Cardiovascular:     Rate and Rhythm: Normal rate.  Pulmonary:     Effort: Pulmonary effort is normal.     Breath sounds: Normal breath sounds.  Abdominal:     General: Abdomen is flat.     Palpations: Abdomen is soft.     Tenderness: There is no abdominal tenderness.  Genitourinary:  Comments: Rectal Exam: Chaperone present, no hemorrhoids or fissures, no mass, no fecal impaction Musculoskeletal:        General: No swelling. Normal range of motion.     Cervical back: Neck supple.  Skin:    General: Skin is warm and dry.  Neurological:     General: No focal deficit present.     Mental Status: He is alert.  Psychiatric:        Mood and Affect: Mood normal.      ED Results / Procedures / Treatments   Labs (all labs ordered are listed, but only abnormal results are displayed) Labs Reviewed - No data to display  EKG None   Radiology No results found.  Procedures Procedures  Medications Ordered in the ED Medications - No data to display   MDM Rules/Calculators/A&P MDM Patient here with  constipation, abdomen is benign and no impaction on rectal exam. Recommend Mag Citrate, Miralax and enemas at home. PCP follow up if that does not resolve his constipation. Encouraged to drink plenty of fluids and avoid constipating foods.  ED Course  I have reviewed the triage vital signs and the nursing notes.  Pertinent labs & imaging results that were available during my care of the patient were reviewed by me and considered in my medical decision making (see chart for details).     Final Clinical Impression(s) / ED Diagnoses Final diagnoses:  Constipation, unspecified constipation type    Rx / DC Orders ED Discharge Orders    None       Pollyann Savoy, MD 12/21/19 [phone]

## 2019-12-21 NOTE — ED Notes (Signed)
ED Provider at bedside. 

## 2019-12-21 NOTE — ED Notes (Addendum)
Pt denies hx of hypertension. Pt educated regarding b/p follow up to have rechecked

## 2019-12-21 NOTE — Discharge Instructions (Addendum)
You should try Magnesium Citrate and Miralax for your constipation. Consider using an enema if this does not work. Drink plenty of water and avoid foods that may constipate you such as cheese and red meat.

## 2019-12-21 NOTE — ED Notes (Addendum)
NT Verlon Au at bedside with EDP at [phone] as chaperone for EDP for rectal exam

## 2019-12-21 NOTE — ED Notes (Signed)
Pt discharged to home. Discharge instructions have been discussed with patient and/or family members. Pt verbally acknowledges understanding d/c instructions, and endorses comprehension to checkout at registration before leaving.  °

## 2020-03-16 ENCOUNTER — Emergency Department (HOSPITAL_BASED_OUTPATIENT_CLINIC_OR_DEPARTMENT_OTHER)
Admission: EM | Admit: 2020-03-16 | Discharge: 2020-03-16 | Disposition: A | Discharge status: Home / Self Care | Payer: Self-pay | Attending: Emergency Medicine | Admitting: Emergency Medicine

## 2020-03-16 ENCOUNTER — Other Ambulatory Visit: Payer: Self-pay

## 2020-03-16 ENCOUNTER — Encounter (HOSPITAL_BASED_OUTPATIENT_CLINIC_OR_DEPARTMENT_OTHER): Payer: Self-pay | Admitting: Emergency Medicine

## 2020-03-16 DIAGNOSIS — Z20822 Contact with and (suspected) exposure to covid-19: Secondary | ICD-10-CM | POA: Insufficient documentation

## 2020-03-16 DIAGNOSIS — F17210 Nicotine dependence, cigarettes, uncomplicated: Secondary | ICD-10-CM | POA: Insufficient documentation

## 2020-03-16 DIAGNOSIS — J020 Streptococcal pharyngitis: Secondary | ICD-10-CM | POA: Insufficient documentation

## 2020-03-16 LAB — GROUP A STREP BY PCR: Group A Strep by PCR: DETECTED — AB

## 2020-03-16 LAB — SARS CORONAVIRUS 2 (TAT 6-24 HRS): SARS Coronavirus 2: NEGATIVE

## 2020-03-16 MED ORDER — ACETAMINOPHEN 325 MG PO TABS
650.0000 mg | ORAL_TABLET | Freq: Once | ORAL | Status: AC | PRN
  Administered 2020-03-16: 650 mg via ORAL
  Filled 2020-03-16: qty 2

## 2020-03-16 MED ORDER — PENICILLIN V POTASSIUM 500 MG PO TABS
500.0000 mg | ORAL_TABLET | Freq: Three times a day (TID) | ORAL | 0 refills | Status: AC
Start: 2020-03-16 — End: 2020-03-26

## 2020-03-16 MED ORDER — DEXAMETHASONE SODIUM PHOSPHATE 4 MG/ML IJ SOLN
4.0000 mg | Freq: Once | INTRAMUSCULAR | Status: AC
  Administered 2020-03-16: 4 mg via INTRAMUSCULAR
  Filled 2020-03-16: qty 1

## 2020-03-16 NOTE — Discharge Instructions (Addendum)
You are seen today for strep throat, your Covid test is pending.  Your strep throat was positive, please take the antibiotics as prescribed for the full 10 days.  Please speak to your pharmacist about any new medications that were prescribed today in regards to side effects or interactions.  Please take Tylenol as directed on the bottle for pain and for your fever.  If you have any new worsening concerning symptoms such as drooling, inability open your mouth, difficulty swallowing or breathing please come back to the emergency department.  If your Covid is positive please follow CDC guidelines.  Please follow-up with your primary care in the next couple of days.  Get help right away if: You vomit. You have a very bad headache. Your neck hurts or feels stiff. You have chest pain or are short of breath. You have drooling, very bad throat pain, or changes in your voice. Your neck is swollen, or the skin gets red and tender. Your mouth is dry, or you are peeing less than normal. You keep feeling more tired or have trouble waking up. Your joints are red or painful.

## 2020-03-16 NOTE — ED Triage Notes (Signed)
sorethroat and feeling bad since sat

## 2020-03-16 NOTE — ED Provider Notes (Signed)
[facility] EMERGENCY DEPARTMENT Provider Note   CSN: [id number] Arrival date & time: 03/16/20  1022     History Chief Complaint  Patient presents with  . Sore Throat  . Fever    Nashton Mckenzie is a 30 y.o. male with no previous medical history that presents the emergency department today for sore throat and fever.  Patient states that he started having the symptoms a couple days ago, worsened over the past day.  Patient states that he normally gets strep throat yearly, around this time.  Patient states that it feels similar.  Patient also states that he feels weak with headache.  Denies any myalgias, shortness of breath, chest pain.  Has not been taking anything for this.  Patient states that he feels exactly the same when he gets strep throat.  Denies any difficulty breathing, difficulty opening his mouth, drooling, wheezing, change in voice.  No other complaints at this time.  Denies a cough.  Has not been vaccinated against Covid.  Denies any sick contacts.  HPI     History reviewed. No pertinent past medical history.  There are no problems to display for this patient.   History reviewed. No pertinent surgical history.     No family history on file.  Social History   Tobacco Use  . Smoking status: Current Every Day Smoker    Packs/day: 1.00    Types: Cigarettes  . Smokeless tobacco: Never Used  Substance Use Topics  . Alcohol use: Yes  . Drug use: Yes    Types: Marijuana    Home Medications Prior to Admission medications   Medication Sig Start Date End Date Taking? Authorizing Provider  penicillin v potassium (VEETID) 500 MG tablet Take 1 tablet (500 mg total) by mouth 3 (three) times daily for 10 days. 03/16/20 03/26/20 Yes Farrel Gordon, PA-C    Allergies    Patient has no known allergies.  Review of Systems   Review of Systems  Constitutional: Negative for chills, diaphoresis, fatigue and fever.  HENT: Positive for sore throat and tinnitus.  Negative for congestion, dental problem, drooling, ear discharge, facial swelling, hearing loss, sinus pain, trouble swallowing and voice change.   Eyes: Negative for pain and visual disturbance.  Respiratory: Negative for cough, shortness of breath and wheezing.   Cardiovascular: Negative for chest pain, palpitations and leg swelling.  Gastrointestinal: Negative for abdominal distention, abdominal pain, diarrhea, nausea and vomiting.  Genitourinary: Negative for difficulty urinating.  Musculoskeletal: Negative for back pain, neck pain and neck stiffness.  Skin: Negative for pallor.  Neurological: Positive for weakness and headaches. Negative for dizziness and speech difficulty.  Psychiatric/Behavioral: Negative for confusion.    Physical Exam Updated Vital Signs BP 126/88 (BP Location: Left Arm)   Pulse 98   Temp 99 F (37.2 C) (Oral)   Resp (!) 22   Ht 6\' 4"  (1.93 m)   Wt 129.3 kg   SpO2 100%   BMI 34.69 kg/m   Physical Exam Constitutional:      General: He is not in acute distress.    Appearance: Normal appearance. He is not ill-appearing, toxic-appearing or diaphoretic.     Comments: Patient without acute respiratory stress.  Patient is sitting comfortably in bed, no tripoding, use of accessory muscles.  Patient is speaking to me in full sentences.  Handling secretions well.  HENT:     Head: Normocephalic and atraumatic.     Jaw: There is normal jaw occlusion. No trismus, swelling or malocclusion.  Comments: No trismus.  No facial swelling.    Nose: No congestion or rhinorrhea.     Right Sinus: No maxillary sinus tenderness or frontal sinus tenderness.     Left Sinus: No maxillary sinus tenderness or frontal sinus tenderness.     Mouth/Throat:     Mouth: Mucous membranes are moist. No oral lesions.     Dentition: Normal dentition.     Tongue: No lesions.     Palate: No mass and lesions.     Pharynx: Uvula midline. Oropharyngeal exudate and posterior oropharyngeal  erythema present. No pharyngeal swelling or uvula swelling.     Tonsils: Tonsillar exudate present. No tonsillar abscesses. 3+ on the right. 3+ on the left.     Comments: Patient with 3+ bilateral tonsillar enlargement with white exudate bilaterally.  Not touching uvula.  No uvula deviation.  No signs of peritonsillar abscess, palate without any tenderness or masses palpated.  No swelling under the tongue, uvula is midline without any inflammation. Eyes:     General: No visual field deficit.       Right eye: No discharge.        Left eye: No discharge.     Extraocular Movements: Extraocular movements intact.     Conjunctiva/sclera: Conjunctivae normal.     Pupils: Pupils are equal, round, and reactive to light.  Cardiovascular:     Rate and Rhythm: Normal rate and regular rhythm.     Pulses: Normal pulses.     Heart sounds: Normal heart sounds. No murmur heard. No friction rub. No gallop.   Pulmonary:     Effort: Pulmonary effort is normal. No respiratory distress.     Breath sounds: Normal breath sounds. No stridor. No wheezing, rhonchi or rales.  Chest:     Chest wall: No tenderness.  Abdominal:     General: Abdomen is flat. Bowel sounds are normal. There is no distension.     Palpations: Abdomen is soft.     Tenderness: There is no abdominal tenderness. There is no right CVA tenderness or left CVA tenderness.  Musculoskeletal:        General: No swelling or tenderness. Normal range of motion.     Cervical back: Normal range of motion. No rigidity or tenderness.     Right lower leg: No edema.     Left lower leg: No edema.  Lymphadenopathy:     Cervical: No cervical adenopathy.  Skin:    General: Skin is warm and dry.     Capillary Refill: Capillary refill takes less than 2 seconds.     Findings: No erythema or rash.  Neurological:     General: No focal deficit present.     Mental Status: He is alert and oriented to person, place, and time.     Cranial Nerves: Cranial nerves  are intact. No cranial nerve deficit or facial asymmetry.     Motor: Motor function is intact. No weakness.     Coordination: Coordination is intact.     Gait: Gait is intact. Gait normal.  Psychiatric:        Mood and Affect: Mood normal.     ED Results / Procedures / Treatments   Labs (all labs ordered are listed, but only abnormal results are displayed) Labs Reviewed  GROUP A STREP BY PCR - Abnormal; Notable for the following components:      Result Value   Group A Strep by PCR DETECTED (*)    All other components within normal  limits  SARS CORONAVIRUS 2 (TAT 6-24 HRS)    EKG None  Radiology No results found.  Procedures Procedures   Medications Ordered in ED Medications  acetaminophen (TYLENOL) tablet 650 mg (650 mg Oral Given 03/16/20 1042)  dexamethasone (DECADRON) injection 4 mg (4 mg Intramuscular Given 03/16/20 1200)    ED Course  I have reviewed the triage vital signs and the nursing notes.  Pertinent labs & imaging results that were available during my care of the patient were reviewed by me and considered in my medical decision making (see chart for details).    MDM Rules/Calculators/A&P                          Antonio Mckenzie is a 30 y.o. male with no previous medical history that presents the emergency department today for sore throat and fever.  Patient with bilateral tonsillar exudate, will give Decadron at this time.  Strep throat and Covid swab ordered.  No trismus, drooling, muffled voice, no concerns of deep space infection or Ludwig's or peritonsillar abscess.  No red flag symptoms.  Strep test positive.  Will give antibiotics for this.  Symptomatic treatment discussed.  Patient to follow-up with PCP.  Strict return precautions given.  Doubt need for further emergent work up at this time. I explained the diagnosis and have given explicit precautions to return to the ER including for any other new or worsening symptoms. The patient understands and  accepts the medical plan as it's been dictated and I have answered their questions. Discharge instructions concerning home care and prescriptions have been given. The patient is STABLE and is discharged to home in good condition.   Final Clinical Impression(s) / ED Diagnoses Final diagnoses:  Strep pharyngitis    Rx / DC Orders ED Discharge Orders         Ordered    penicillin v potassium (VEETID) 500 MG tablet  3 times daily        03/16/20 1252           Farrel Gordon, PA-C 03/16/20 1255    Terrilee Files, MD 03/16/20 [phone]

## 2021-10-15 ENCOUNTER — Encounter (HOSPITAL_BASED_OUTPATIENT_CLINIC_OR_DEPARTMENT_OTHER): Payer: Self-pay | Admitting: Emergency Medicine

## 2021-10-15 ENCOUNTER — Emergency Department (HOSPITAL_BASED_OUTPATIENT_CLINIC_OR_DEPARTMENT_OTHER)
Admission: EM | Admit: 2021-10-15 | Discharge: 2021-10-15 | Disposition: A | Discharge status: Home / Self Care | Payer: Self-pay | Attending: Emergency Medicine | Admitting: Emergency Medicine

## 2021-10-15 ENCOUNTER — Other Ambulatory Visit: Payer: Self-pay

## 2021-10-15 DIAGNOSIS — J029 Acute pharyngitis, unspecified: Secondary | ICD-10-CM | POA: Insufficient documentation

## 2021-10-15 DIAGNOSIS — Z20822 Contact with and (suspected) exposure to covid-19: Secondary | ICD-10-CM | POA: Insufficient documentation

## 2021-10-15 LAB — RESP PANEL BY RT-PCR (FLU A&B, COVID) ARPGX2
Influenza A by PCR: NEGATIVE
Influenza B by PCR: NEGATIVE
SARS Coronavirus 2 by RT PCR: NEGATIVE

## 2021-10-15 LAB — GROUP A STREP BY PCR: Group A Strep by PCR: NOT DETECTED

## 2021-10-15 MED ORDER — ACETAMINOPHEN 500 MG PO TABS
1000.0000 mg | ORAL_TABLET | Freq: Once | ORAL | Status: AC
  Administered 2021-10-15: 1000 mg via ORAL
  Filled 2021-10-15: qty 2

## 2021-10-15 NOTE — ED Provider Notes (Signed)
[facility] EMERGENCY DEPARTMENT Provider Note   CSN: [id number] Arrival date & time: 10/15/21  1804     History  Chief Complaint  Patient presents with   Sore Throat    Antonio Mckenzie is a 31 y.o. male.  Patient presents to the hospital complaining of sore throat which began yesterday afternoon.  Also endorses subjective fever at home.  Patient states that he has had a hard time drinking water due to pain and swelling.  The patient states that he has had a history of this happening typically at least once a year and has been recommended that he have his tonsils removed in the past.  The patient states he is scared of surgery and has not pursued this.  The patient denies cough, shortness of breath, chest pain, abdominal pain, nausea, vomiting.  Endorses sore throat, fever.  Past medical history significant for multiple episodes of strep pharyngitis  HPI     Home Medications Prior to Admission medications   Not on File      Allergies    Patient has no known allergies.    Review of Systems   Review of Systems  Constitutional:  Positive for fever.  HENT:  Positive for sore throat.   Respiratory:  Negative for cough and shortness of breath.   Cardiovascular:  Negative for chest pain.  Gastrointestinal:  Negative for abdominal pain, nausea and vomiting.  Neurological:  Negative for headaches.    Physical Exam Updated Vital Signs BP (!) 140/77   Pulse 97   Temp (!) 101.2 F (38.4 C) (Oral)   Resp 17   Ht 6\' 4"  (1.93 m)   Wt (!) 145.4 kg   SpO2 99%   BMI 39.02 kg/m  Physical Exam Vitals and nursing note reviewed.  Constitutional:      General: He is not in acute distress.    Appearance: He is obese.  HENT:     Head: Normocephalic and atraumatic.     Mouth/Throat:     Mouth: Mucous membranes are moist.     Pharynx: Uvula midline. Oropharyngeal exudate and posterior oropharyngeal erythema present. No pharyngeal swelling.     Tonsils: Tonsillar exudate  present.  Cardiovascular:     Rate and Rhythm: Normal rate and regular rhythm.     Heart sounds: Normal heart sounds.  Pulmonary:     Effort: Pulmonary effort is normal.     Breath sounds: Normal breath sounds.  Abdominal:     Palpations: Abdomen is soft.  Musculoskeletal:     Cervical back: Normal range of motion and neck supple.  Lymphadenopathy:     Cervical: Cervical adenopathy present.  Skin:    General: Skin is warm and dry.     Capillary Refill: Capillary refill takes less than 2 seconds.  Neurological:     Mental Status: He is alert and oriented to person, place, and time.     ED Results / Procedures / Treatments   Labs (all labs ordered are listed, but only abnormal results are displayed) Labs Reviewed  RESP PANEL BY RT-PCR (FLU A&B, COVID) [id number]  GROUP A STREP BY PCR  CULTURE, GROUP A STREP [facility])    EKG None  Radiology No results found.  Procedures Procedures    Medications Ordered in ED Medications  acetaminophen (TYLENOL) tablet 1,000 mg (1,000 mg Oral Given 10/15/21 1828)    ED Course/ Medical Decision Making/ A&P  Medical Decision Making Risk OTC drugs.   Patient presents with a chief complaint of sore throat.  Differential includes but is not limited to strep pharyngitis, tonsillitis, COVID-19, influenza, and others  I viewed the patient's past medical history including multiple emergency department visits for the diagnosis of strep pharyngitis.  I ordered group A strep and respiratory panel.  Group A strep was negative.  The respiratory panel testing for coronavirus and influenza AMB was also negative.  Based on the patient's history, presentation, Centor criteria, there is a possibility that the PCR group A strep test is a false negative.  Group A strep culture submitted.  Recommended the patient take over-the-counter pain medications/antipyretics such as Advil/Tylenol for fever and pain control.  The patient will  be contacted if the strep culture comes back positive.  I recommend he drink cold drinks and popsicles for additional symptom management. He has no respiratory symptoms at this time and I see no indication for admission.  He may discharge home        Final Clinical Impression(s) / ED Diagnoses Final diagnoses:  Pharyngitis, unspecified etiology    Rx / DC Orders ED Discharge Orders     None         Pamala Duffel 10/15/21 Jodelle Green, MD 10/15/21 [phone]

## 2021-10-15 NOTE — Discharge Instructions (Signed)
You were seen today for sore throat.  Your testing for group A strep, COVID-19, influenza A, influenza B came back negative at this time.  I have submitted a sample for culture for group A strep.  If this comes back positive you will be contacted and a prescription will be provided for antibiotics.  At this time I recommend symptom management with Tylenol and Advil to control both fever and sore throat.  You develop any life-threatening conditions please return to the emergency department for further evaluation

## 2021-10-15 NOTE — ED Triage Notes (Signed)
Patient c/o sore throat since yesterday afternoon. States he has been unable to drink any water due to swelling. Patient states "this happens every year and I have been told I need to get my tonsils removed, but I am scared of surgery".

## 2021-10-16 ENCOUNTER — Emergency Department (HOSPITAL_COMMUNITY)
Admission: EM | Admit: 2021-10-16 | Discharge: 2021-10-17 | Disposition: A | Discharge status: Home / Self Care | Payer: Self-pay | Attending: Emergency Medicine | Admitting: Emergency Medicine

## 2021-10-16 ENCOUNTER — Encounter (HOSPITAL_COMMUNITY): Payer: Self-pay | Admitting: Emergency Medicine

## 2021-10-16 ENCOUNTER — Other Ambulatory Visit: Payer: Self-pay

## 2021-10-16 DIAGNOSIS — J36 Peritonsillar abscess: Secondary | ICD-10-CM | POA: Insufficient documentation

## 2021-10-16 LAB — GROUP A STREP BY PCR: Group A Strep by PCR: NOT DETECTED

## 2021-10-16 MED ORDER — ACETAMINOPHEN 325 MG PO TABS
650.0000 mg | ORAL_TABLET | Freq: Once | ORAL | Status: AC | PRN
  Administered 2021-10-16: 650 mg via ORAL
  Filled 2021-10-16: qty 2

## 2021-10-16 NOTE — ED Triage Notes (Signed)
Pt reported to ED with c/o sore throat, pt states he feels that throat is swollen more on right than left. Has hx of recurrent strep.

## 2021-10-17 ENCOUNTER — Emergency Department (HOSPITAL_COMMUNITY): Payer: Self-pay

## 2021-10-17 LAB — CULTURE, GROUP A STREP (THRC)

## 2021-10-17 LAB — I-STAT CHEM 8, ED
BUN: 10 mg/dL (ref 6–20)
Calcium, Ion: 1.14 mmol/L — ABNORMAL LOW (ref 1.15–1.40)
Chloride: 100 mmol/L (ref 98–111)
Creatinine, Ser: 1 mg/dL (ref 0.61–1.24)
Glucose, Bld: 100 mg/dL — ABNORMAL HIGH (ref 70–99)
HCT: 50 % (ref 39.0–52.0)
Hemoglobin: 17 g/dL (ref 13.0–17.0)
Potassium: 3.8 mmol/L (ref 3.5–5.1)
Sodium: 136 mmol/L (ref 135–145)
TCO2: 23 mmol/L (ref 22–32)

## 2021-10-17 LAB — CBC WITH DIFFERENTIAL/PLATELET
Abs Immature Granulocytes: 0.05 10*3/uL (ref 0.00–0.07)
Basophils Absolute: 0 10*3/uL (ref 0.0–0.1)
Basophils Relative: 0 %
Eosinophils Absolute: 0 10*3/uL (ref 0.0–0.5)
Eosinophils Relative: 0 %
HCT: 46 % (ref 39.0–52.0)
Hemoglobin: 15.8 g/dL (ref 13.0–17.0)
Immature Granulocytes: 0 %
Lymphocytes Relative: 15 %
Lymphs Abs: 1.9 10*3/uL (ref 0.7–4.0)
MCH: 26.4 pg (ref 26.0–34.0)
MCHC: 34.3 g/dL (ref 30.0–36.0)
MCV: 76.9 fL — ABNORMAL LOW (ref 80.0–100.0)
Monocytes Absolute: 1.4 10*3/uL — ABNORMAL HIGH (ref 0.1–1.0)
Monocytes Relative: 11 %
Neutro Abs: 9.8 10*3/uL — ABNORMAL HIGH (ref 1.7–7.7)
Neutrophils Relative %: 74 %
Platelets: 301 10*3/uL (ref 150–400)
RBC: 5.98 MIL/uL — ABNORMAL HIGH (ref 4.22–5.81)
RDW: 15.2 % (ref 11.5–15.5)
WBC: 13.2 10*3/uL — ABNORMAL HIGH (ref 4.0–10.5)
nRBC: 0 % (ref 0.0–0.2)

## 2021-10-17 LAB — COMPREHENSIVE METABOLIC PANEL
ALT: 24 U/L (ref 0–44)
AST: 20 U/L (ref 15–41)
Albumin: 4 g/dL (ref 3.5–5.0)
Alkaline Phosphatase: 55 U/L (ref 38–126)
Anion gap: 11 (ref 5–15)
BUN: 10 mg/dL (ref 6–20)
CO2: 23 mmol/L (ref 22–32)
Calcium: 9.7 mg/dL (ref 8.9–10.3)
Chloride: 102 mmol/L (ref 98–111)
Creatinine, Ser: 1.14 mg/dL (ref 0.61–1.24)
GFR, Estimated: >60 mL/min (ref >60)
Glucose, Bld: 102 mg/dL — ABNORMAL HIGH (ref 70–99)
Potassium: 4 mmol/L (ref 3.5–5.1)
Sodium: 136 mmol/L (ref 135–145)
Total Bilirubin: 0.9 mg/dL (ref 0.3–1.2)
Total Protein: 8.5 g/dL — ABNORMAL HIGH (ref 6.5–8.1)

## 2021-10-17 MED ORDER — HYDROCODONE-ACETAMINOPHEN 7.5-325 MG/15ML PO SOLN: 15.0000 mL | Freq: Four times a day (QID) | ORAL | 0 refills | Status: AC | PRN

## 2021-10-17 MED ORDER — CLINDAMYCIN PHOSPHATE 600 MG/50ML IV SOLN
600.0000 mg | Freq: Once | INTRAVENOUS | Status: AC
Start: 2021-10-17 — End: 2021-10-17
  Administered 2021-10-17: 600 mg via INTRAVENOUS
  Filled 2021-10-17: qty 50

## 2021-10-17 MED ORDER — DEXAMETHASONE SODIUM PHOSPHATE 10 MG/ML IJ SOLN
10.0000 mg | Freq: Once | INTRAMUSCULAR | Status: AC
Start: 2021-10-17 — End: 2021-10-17
  Administered 2021-10-17: 10 mg via INTRAVENOUS
  Filled 2021-10-17: qty 1

## 2021-10-17 MED ORDER — IOHEXOL 300 MG/ML  SOLN
75.0000 mL | Freq: Once | INTRAMUSCULAR | Status: AC | PRN
  Administered 2021-10-17: 75 mL via INTRAVENOUS

## 2021-10-17 MED ORDER — KETOROLAC TROMETHAMINE 30 MG/ML IJ SOLN
30.0000 mg | Freq: Once | INTRAMUSCULAR | Status: AC
  Administered 2021-10-17: 30 mg via INTRAVENOUS
  Filled 2021-10-17: qty 1

## 2021-10-17 MED ORDER — CLINDAMYCIN HCL 150 MG PO CAPS: 300.0000 mg | ORAL_CAPSULE | Freq: Three times a day (TID) | ORAL | 0 refills | Status: AC

## 2021-10-17 NOTE — Discharge Instructions (Addendum)
Take the prescribed medication as directed. Follow-up with Dr. Jenne Pane on Tuesday (office will be closed Monday for holiday). Return to the ED for new or worsening symptoms-- uncontrolled fever, increased difficulty swallowing, shortness of breath, etc.

## 2021-10-17 NOTE — ED Provider Notes (Signed)
[facility] EMERGENCY DEPARTMENT Provider Note   CSN: [id number] Arrival date & time: 10/16/21  1921     History  Chief Complaint  Patient presents with   Sore Throat    Antonio Mckenzie is a 31 y.o. male.  The history is provided by the patient and medical records.  Sore Throat   31 y.o. M here with sore throat.  Seen here 10/15/21 for same, negative strep testing, culture in process.  States pain worsening since then, more trouble swallowing and voice has changed.  He denies vomiting.  No fever/chills.  He did receive decadron in triage which seemed to help with pain.  Home Medications Prior to Admission medications   Not on File      Allergies    Patient has no known allergies.    Review of Systems   Review of Systems  HENT:  Positive for sore throat.   All other systems reviewed and are negative.   Physical Exam Updated Vital Signs BP (!) 170/93 (BP Location: Right Arm)   Pulse 87   Temp 98.3 F (36.8 C) (Oral)   Resp 18   SpO2 97%   Physical Exam Vitals and nursing note reviewed.  Constitutional:      Appearance: He is well-developed.  HENT:     Head: Normocephalic and atraumatic.     Comments: Tonsils asymmetric, right > left without uvular deviation, exudates present on right tonsil, handling secretions, no stridor, slight hot potato voice (wife states improved from time of arrival) Eyes:     Conjunctiva/sclera: Conjunctivae normal.     Pupils: Pupils are equal, round, and reactive to light.  Cardiovascular:     Rate and Rhythm: Normal rate and regular rhythm.     Heart sounds: Normal heart sounds.  Pulmonary:     Effort: Pulmonary effort is normal.     Breath sounds: Normal breath sounds.  Abdominal:     General: Bowel sounds are normal.     Palpations: Abdomen is soft.  Musculoskeletal:        General: Normal range of motion.     Cervical back: Normal range of motion.  Skin:    General: Skin is warm and dry.  Neurological:      Mental Status: He is alert and oriented to person, place, and time.     ED Results / Procedures / Treatments   Labs (all labs ordered are listed, but only abnormal results are displayed) Labs Reviewed  CBC WITH DIFFERENTIAL/PLATELET - Abnormal; Notable for the following components:      Result Value   WBC 13.2 (*)    RBC 5.98 (*)    MCV 76.9 (*)    Neutro Abs 9.8 (*)    Monocytes Absolute 1.4 (*)    All other components within normal limits  COMPREHENSIVE METABOLIC PANEL - Abnormal; Notable for the following components:   Glucose, Bld 102 (*)    Total Protein 8.5 (*)    All other components within normal limits  I-STAT CHEM 8, ED - Abnormal; Notable for the following components:   Glucose, Bld 100 (*)    Calcium, Ion 1.14 (*)    All other components within normal limits  GROUP A STREP BY PCR    EKG None  Radiology CT Soft Tissue Neck W Contrast  Result Date: 10/17/2021 CLINICAL DATA:  Initial evaluation for acute sore throat. EXAM: CT NECK WITH CONTRAST TECHNIQUE: Multidetector CT imaging of the neck was performed using the standard  protocol following the bolus administration of intravenous contrast. RADIATION DOSE REDUCTION: This exam was performed according to the departmental dose-optimization program which includes automated exposure control, adjustment of the mA and/or kV according to patient size and/or use of iterative reconstruction technique. CONTRAST:  30mL OMNIPAQUE IOHEXOL 300 MG/ML  SOLN COMPARISON:  None Available. FINDINGS: Pharynx and larynx: Oral cavity within normal limits. No acute abnormality about the dentition. Palatine tonsils are markedly enlarged and hyperenhancing, consistent with acute tonsillitis. Superimposed hypodense collection at the inferior margin of the right tonsil measures 1.3 x 0.9 x 1.2 cm, consistent with tonsillar/peritonsillar abscess. Additional smaller right-sided tonsillar/peritonsillar abscess seen superiorly, measuring 1.0 x 0.5 x  0.9 cm (series 3, image 52). Additional somewhat ill-defined phlegmonous change noted elsewhere throughout the right tonsil. Tonsils abut at the midline. Mild mucosal edema within the adjacent oropharyngeal mucosa. Adenoidal soft tissues prominent and hypertrophied as well. No retropharyngeal swelling or collection. Epiglottis within normal limits. Hypopharynx and supraglottic larynx within normal limits. Glottis normal. Subglottic airway patent clear. Salivary glands: Salivary glands including the parotid and submandibular glands are within normal limits. Thyroid: Normal. Lymph nodes: Enlarged upper cervical lymph nodes, most pronounced at level 2 measuring up to 1.8 cm on the right. Findings presumably reactive. Vascular: Normal intravascular enhancement seen throughout the neck. Limited intracranial: Unremarkable. Visualized orbits: Unremarkable. Mastoids and visualized paranasal sinuses: Mild mucosal thickening present about the ethmoidal air cells approximate sinuses. Visualized paranasal sinuses are otherwise clear. Mastoid air cells and middle ear cavities are well pneumatized and free of fluid. Skeleton: No discrete or worrisome osseous lesions. Upper chest: Unremarkable. Other: Unremarkable. IMPRESSION: 1. Findings consistent with acute tonsillitis. Two distinct right-sided tonsillar/peritonsillar abscesses measuring up to 1.3 cm as above. 2. Enlarged upper cervical lymph nodes, presumably reactive. Electronically Signed   By: Rise Mu M.D.   On: 10/17/2021 01:26    Procedures Procedures    Medications Ordered in ED Medications  acetaminophen (TYLENOL) tablet 650 mg (650 mg Oral Given 10/16/21 2206)  dexamethasone (DECADRON) injection 10 mg (10 mg Intravenous Given 10/17/21 0021)  iohexol (OMNIPAQUE) 300 MG/ML solution 75 mL (75 mLs Intravenous Contrast Given 10/17/21 0052)  ketorolac (TORADOL) 30 MG/ML injection 30 mg (30 mg Intravenous Given 10/17/21 0317)  clindamycin (CLEOCIN) IVPB 600  mg (0 mg Intravenous Stopped 10/17/21 0401)    ED Course/ Medical Decision Making/ A&P                           Medical Decision Making Risk OTC drugs. Prescription drug management.   31 year old male presenting to the ED with sore throat.  He was seen here 2 days ago for same with negative strep testing, cultures in process.  States increased difficulty swallowing and change in voice.  He is afebrile, nontoxic.  Does have somewhat of a hot potato voice, handling secretions well, no stridor.  He received Decadron in triage which he reports has helped quite a bit.  Does have asymmetric tonsillar edema, right larger than left but uvula remains midline.  Exudates present on right tonsil.  Labs as above--leukocytosis present, no electrolyte derangement.  CT with findings of tonsillitis and 2 separate right tonsillar/peritonsillar abscesses measuring 1.3 cm.  Will discuss with ENT.  Started IV clindamycin.  2:02 AM Discussed with on call ENT, Dr. Jenne Pane-- abscess is on the larger side but given improvement with decadron and continues to tolerate secretions, feel worth a trial of OP management.  Will follow-up in  office on Tuesday given holiday on Monday.  Continues tolerating secretions well, no distress noted.  VSS.  Continues to improve with interventions here.  Stable for discharge with clindamycin and hycet.  Encouraged to follow-up with ENT.  Return here for any new/acute changes.  Final Clinical Impression(s) / ED Diagnoses Final diagnoses:  Peritonsillar abscess    Rx / DC Orders ED Discharge Orders          Ordered    clindamycin (CLEOCIN) 150 MG capsule  3 times daily        10/17/21 0357    HYDROcodone-acetaminophen (HYCET) 7.5-325 mg/15 ml solution  4 times daily PRN        10/17/21 0357              Garlon Hatchet, PA-C 10/17/21 0402    Melene Plan, DO 10/17/21 [phone]

## 2021-10-17 NOTE — ED Notes (Signed)
Patient to room after scans

## 2021-10-17 NOTE — ED Provider Triage Note (Signed)
Emergency Medicine Provider Triage Evaluation Note  Antonio Mckenzie , a 31 y.o. male  was evaluated in triage.  Pt complains of sore throat for the past two days. Reports pain with eating and drinking.   Review of Systems  Positive:  Negative:   Physical Exam  BP (!) 154/82 (BP Location: Left Arm)   Pulse 91   Temp 98.5 F (36.9 C) (Oral)   Resp 20   SpO2 100%  Gen:   Awake, no distress   Resp:  Normal effort  MSK:   Moves extremities without difficulty  Other:  Kissing tonsils bilaterally with erythema and exudate. Controlling secretions. Hot potato voice.   Medical Decision Making  Medically screening exam initiated at 12:09 AM.  Appropriate orders placed.  Antonio Mckenzie was informed that the remainder of the evaluation will be completed by another provider, this initial triage assessment does not replace that evaluation, and the importance of remaining in the ED until their evaluation is complete.  CT soft tissue ordered and labs. Will give decadron.    Achille Rich, PA-C 10/17/21 0012

## 2021-10-18 LAB — CULTURE, GROUP A STREP (THRC)

## 2021-10-19 ENCOUNTER — Telehealth: Payer: Self-pay | Admitting: *Deleted

## 2021-10-19 NOTE — Telephone Encounter (Signed)
Post ED Visit - Positive Culture Follow-up  Culture report reviewed by antimicrobial stewardship pharmacist: [facility] []  , Pharm.D. []  Enzo Bi, Pharm.D., BCPS AQ-ID []  , Pharm.D., BCPS []  Celedonio Miyamoto, .D., BCPS []  [place], .D., BCPS, AAHIVP []  Georgina Pillion, Pharm.D., BCPS, AAHIVP []  [address], PharmD, BCPS []  , PharmD, BCPS []  [place], PharmD, BCPS []  [address], PharmD []  , PharmD, BCPS []  Estella Husk, PharmD  Pharmacy Team []  Lysle Pearl, PharmD []  , PharmD []  Phillips Climes, PharmD []  , Rph []  Agapito Games) , PharmD []  Verlan Friends, PharmD []  , PharmD []  Mervyn Gay, PharmD []  , PharmD []  Vinnie Level, PharmD []  [facility], PharmD []  , PharmD []  Len Childs, PharmD   Positive srep culture Treated with Clindamycin IV, organism sensitive to the same and no further patient follow-up is required at this time.  Reviewed by Pharmacy 10/19/2021, 10:37 AM
